# Patient Record
Sex: Male | Born: 2000 | Race: White | Hispanic: No | Marital: Married | Smoking: Never smoker
Health system: Southern US, Community
[De-identification: ages and names within clinical notes are randomized; demographics above are authoritative.]

## PROBLEM LIST (undated history)

## (undated) HISTORY — PX: NO PAST SURGERIES: SHX2092

---
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
## 2023-11-16 ENCOUNTER — Telehealth: Admitting: Physician Assistant

## 2023-11-16 DIAGNOSIS — M549 Dorsalgia, unspecified: Secondary | ICD-10-CM

## 2023-11-16 NOTE — Progress Notes (Signed)
  Because of chronic back pain and need for exam to ensure proper treatment, I feel your condition warrants further evaluation and I recommend that you be seen in a face-to-face visit. If you do not have a PCP outside of the [facility], I recommend being seen at one of our in-person locations (see link below).   NOTE: There will be NO CHARGE for this E-Visit   If you are having a true medical emergency, please call 911.     For an urgent face to face visit, [place] has multiple urgent care centers for your convenience.  Click the link below for the full list of locations and hours, walk-in wait times, appointment scheduling options and driving directions:  Urgent Care - [place], [place], [place], [place], [place], [place]  [place]     Your MyChart E-visit questionnaire answers were reviewed by a board certified advanced clinical practitioner to complete your personal care plan based on your specific symptoms.    Thank you for using e-Visits.

## 2024-01-11 ENCOUNTER — Ambulatory Visit: Admitting: Family Medicine

## 2024-01-11 ENCOUNTER — Encounter: Payer: Self-pay | Admitting: Family Medicine

## 2024-01-11 VITALS — BP 120/82 | HR 83 | Temp 98.5°F | Resp 18 | Ht 72.0 in | Wt 190.4 lb

## 2024-01-11 DIAGNOSIS — M542 Cervicalgia: Secondary | ICD-10-CM

## 2024-01-11 DIAGNOSIS — G43009 Migraine without aura, not intractable, without status migrainosus: Secondary | ICD-10-CM | POA: Diagnosis not present

## 2024-01-11 DIAGNOSIS — G8929 Other chronic pain: Secondary | ICD-10-CM | POA: Diagnosis not present

## 2024-01-11 DIAGNOSIS — R868 Other abnormal findings in specimens from male genital organs: Secondary | ICD-10-CM | POA: Diagnosis not present

## 2024-01-11 MED ORDER — NURTEC 75 MG PO TBDP: 75.0000 mg | ORAL_TABLET | Freq: Every day | ORAL | Status: AC | PRN

## 2024-01-11 MED ORDER — CYCLOBENZAPRINE HCL 10 MG PO TABS: 10.0000 mg | ORAL_TABLET | Freq: Three times a day (TID) | ORAL | 0 refills | Status: AC | PRN

## 2024-01-11 NOTE — Progress Notes (Signed)
 New Patient Office Visit  Subjective    Patient ID: Clinton Stout, male    DOB: 2001-01-06  Age: 23 y.o. MRN: [id number]  CC:  Chief Complaint  Patient presents with   Infertility   Back Pain    Discussed the use of AI scribe software for clinical note transcription with the patient, who gave verbal consent to proceed.  History of Present Illness   Clinton Stout is a 23 year old male who presents to establish care and discuss chronic neck pain and headaches.  Chronic neck pain - Neck pain present for approximately one year - Pain is bilateral and sometimes radiates downward - Described as constant tension - Pain worsens with activity and is relieved by stretching - [facility]; detailed results not received - Pain managed with frequent use of extra strength Tylenol  Headaches - Headaches occur every two to three days - Typically located at the back or top of the head - Sometimes associated with neck pain - Prefers to be in a dark room with lights off during headaches - Tylenol provides relief, but requires a few hours for effect - No prescribed medication for headaches - No aura or warning signs preceding headaches  Low back pain and knee pain - History of low back pain and knee pain documented through the [facility] - No current medication for these conditions - No history of physical therapy - Placed on light duty during [employer] service due to these symptoms  Tinnitus - Intermittent ringing in the ears - Symptom is not constant - No medication for tinnitus  Infertility - Diagnosed with low sperm motility and concentration while in the [employer] - No treatment received due to discharge from [employer] shortly after diagnosis       Outpatient Encounter Medications as of 01/11/2024  Medication Sig   cyclobenzaprine (FLEXERIL) 10 MG tablet Take 1 tablet (10 mg total) by mouth 3 (three) times daily as needed for  muscle spasms.   Rimegepant Sulfate (NURTEC) 75 MG TBDP Take 1 tablet (75 mg total) by mouth daily as needed.   [DISCONTINUED] fexofenadine (ALLEGRA) 180 MG tablet Take 180 mg by mouth daily.   [DISCONTINUED] ibuprofen  (ADVIL ) 600 MG tablet Take 1 tablet (600 mg total) by mouth every 8 (eight) hours as needed for moderate pain.   [DISCONTINUED] lidocaine  (XYLOCAINE ) 2 % solution Gargle 15 mL every 3 hours as needed for sore throat. May swallow if desired.   No facility-administered encounter medications on file as of 01/11/2024.    History reviewed. No pertinent past medical history.  Past Surgical History:  Procedure Laterality Date   NO PAST SURGERIES      Family History  Problem Relation Age of Onset   Healthy Mother    Heart disease Father    Healthy Sister    Healthy Brother     Social History   Socioeconomic History   Marital status: Married    Spouse name: Not on file   Number of children: Not on file   Years of education: Not on file   Highest education level: Not on file  Occupational History   Not on file  Tobacco Use   Smoking status: Never   Smokeless tobacco: Never  Vaping Use   Vaping status: Every Day  Substance and Sexual Activity   Alcohol use: Yes    Alcohol/week: 4.0 standard drinks of alcohol    Types: 4 Cans of beer per  week   Drug use: No   Sexual activity: Yes  Other Topics Concern   Not on file  Social History Narrative   Not on file   Social Drivers of Health   Financial Resource Strain: Not on file  Food Insecurity: Not on file  Transportation Needs: Not on file  Physical Activity: Not on file  Stress: Not on file  Social Connections: Not on file  Intimate Partner Violence: Not on file    Review of Systems  Constitutional:  Negative for chills, fever and malaise/fatigue.  HENT:  Negative for congestion, ear pain, sinus pain and sore throat.   Eyes: Negative.   Respiratory:  Negative for cough, shortness of breath and wheezing.    Cardiovascular:  Negative for chest pain, palpitations and leg swelling.  Gastrointestinal:  Negative for constipation, diarrhea, nausea and vomiting.  Genitourinary:  Negative for dysuria, frequency and urgency.  Musculoskeletal:  Positive for neck pain (bilateral). Negative for back pain.  Skin: Negative.   Neurological:  Positive for headaches (back or top of the head). Negative for dizziness.  Endo/Heme/Allergies: Negative.         Objective    BP 120/82   Pulse 83   Temp 98.5 F (36.9 C) (Temporal)   Resp 18   Ht 6' (1.829 m)   Wt 190 lb 6.4 oz (86.4 kg)   SpO2 99%   BMI 25.82 kg/m   Physical Exam Constitutional:      General: He is not in acute distress.    Appearance: Normal appearance. He is not ill-appearing.  HENT:     Nose: No congestion.  Eyes:     Conjunctiva/sclera: Conjunctivae normal.  Cardiovascular:     Rate and Rhythm: Normal rate and regular rhythm.     Heart sounds: Normal heart sounds. No murmur heard. Pulmonary:     Effort: Pulmonary effort is normal. No respiratory distress.     Breath sounds: Normal breath sounds. No wheezing.  Abdominal:     Palpations: Abdomen is soft.  Musculoskeletal:        General: Normal range of motion.  Skin:    General: Skin is warm.  Neurological:     Mental Status: He is alert and oriented to person, place, and time. Mental status is at baseline.  Psychiatric:        Mood and Affect: Mood normal.        Behavior: Behavior normal.         Assessment & Plan:   Problem List Items Addressed This Visit       Cardiovascular and Mediastinum   Migraine without aura and without status migrainosus, not intractable   Migraine without aura Migraines every two to three days, linked to neck pain. No aura. Tylenol provides partial relief. Nurtec sample given to assess efficacy. Triptan caused sleepiness. - Provided Nurtec sample for migraine management. - Advised to report Nurtec efficacy for potential  prescription. - Discussed insurance coverage for Nurtec and alternatives if not covered.      Relevant Medications   Rimegepant Sulfate (NURTEC) 75 MG TBDP   cyclobenzaprine (FLEXERIL) 10 MG tablet     Other   Chronic neck pain - Primary   Cervicalgia (chronic neck pain) Chronic neck pain for one year, worsened by activity, relieved by manipulation. Associated with headaches. Previous x-rays showed scoliosis. No current medication, uses Tylenol. - Referred to sports medicine for evaluation and management. - Prescribed Flexeril for nighttime muscle relaxation. - Provided neck exercises and  stretches. - Advised to reduce Tylenol by incorporating Flexeril.      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 No treatment initiated. Seeking further evaluation. - Investigate referral options for male infertility specialist. - Advised consultation with wife's gynecologist for infertility specialist referral.       Follow-up: Return in about 2 weeks (around 01/25/2024) for Annual Physical, fasting, lab visit.   Harrie Cedar, FNP [facility] [phone]

## 2024-01-11 NOTE — Patient Instructions (Signed)
 Clinton Chang, DO  - Referral

## 2024-01-14 DIAGNOSIS — R868 Other abnormal findings in specimens from male genital organs: Secondary | ICD-10-CM | POA: Insufficient documentation

## 2024-01-14 NOTE — Assessment & Plan Note (Signed)
 Cervicalgia (chronic neck pain) Chronic neck pain for one year, worsened by activity, relieved by manipulation. Associated with headaches. Previous x-rays showed scoliosis. No current medication, uses Tylenol. - Referred to sports medicine for evaluation and management. - Prescribed Flexeril for nighttime muscle relaxation. - Provided neck exercises and stretches. - Advised to reduce Tylenol by incorporating Flexeril.

## 2024-01-14 NOTE — Assessment & Plan Note (Signed)
 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 No treatment initiated. Seeking further evaluation. - Investigate referral options for male infertility specialist. - Advised consultation with wife's gynecologist for infertility specialist referral.

## 2024-01-14 NOTE — Assessment & Plan Note (Signed)
 Migraine without aura Migraines every two to three days, linked to neck pain. No aura. Tylenol provides partial relief. Nurtec sample given to assess efficacy. Triptan caused sleepiness. - Provided Nurtec sample for migraine management. - Advised to report Nurtec efficacy for potential prescription. - Discussed insurance coverage for Nurtec and alternatives if not covered.

## 2024-01-30 ENCOUNTER — Encounter: Payer: Self-pay | Admitting: Family Medicine

## 2024-01-30 ENCOUNTER — Ambulatory Visit (INDEPENDENT_AMBULATORY_CARE_PROVIDER_SITE_OTHER): Admitting: Family Medicine

## 2024-01-30 VITALS — BP 130/78 | HR 87 | Temp 98.8°F | Resp 18 | Ht 72.0 in | Wt 191.0 lb

## 2024-01-30 DIAGNOSIS — G8929 Other chronic pain: Secondary | ICD-10-CM

## 2024-01-30 DIAGNOSIS — R868 Other abnormal findings in specimens from male genital organs: Secondary | ICD-10-CM

## 2024-01-30 DIAGNOSIS — Z0000 Encounter for general adult medical examination without abnormal findings: Secondary | ICD-10-CM | POA: Diagnosis not present

## 2024-01-30 DIAGNOSIS — M542 Cervicalgia: Secondary | ICD-10-CM | POA: Diagnosis not present

## 2024-01-30 NOTE — Progress Notes (Signed)
 Subjective:  Patient ID: Clinton Stout, male    DOB: 2000-05-21  Age: 23 y.o. MRN: [id number]  Chief Complaint  Patient presents with   Annual Exam   HPI:  Well Adult Physical: Patient here for a comprehensive physical exam.The patient reports no problems Do you take any herbs or supplements that were not prescribed by a doctor? no Are you taking calcium supplements? no Are you taking aspirin daily? no  Encounter for general adult medical examination without abnormal findings  Physical (At Risk items are starred): Patient's last physical exam was 1 year ago .  Patient wears a seat belt, has smoke detectors, has carbon monoxide detectors, practices appropriate gun safety, and wears sunscreen with extended sun exposure. Dental Care: biannual cleanings, brushes and flosses daily. Ophthalmology/Optometry: Annual visit.  Hearing loss: none Vision impairments: none Last PSA:     01/30/2024    8:04 AM  Depression screen PHQ 2/9  Decreased Interest 1  Down, Depressed, Hopeless 0  PHQ - 2 Score 1  Altered sleeping 2  Tired, decreased energy 2  Change in appetite 0  Feeling bad or failure about yourself  0  Trouble concentrating 0  Moving slowly or fidgety/restless 0  Suicidal thoughts 0  PHQ-9 Score 5  Difficult doing work/chores Somewhat difficult         01/30/2024    8:04 AM  Fall Risk  Falls in the past year? 0  Was there an injury with Fall? 0  Fall Risk Category Calculator 0  Patient at Risk for Falls Due to No Fall Risks  Fall risk Follow up Falls evaluation completed              History reviewed. No pertinent past medical history. Past Surgical History:  Procedure Laterality Date   NO PAST SURGERIES      Family History  Problem Relation Age of Onset   Healthy Mother    Heart disease Father    Healthy Sister    Healthy Brother    Social History   Socioeconomic History   Marital status: Married    Spouse name: Not on file   Number of  children: Not on file   Years of education: Not on file   Highest education level: Not on file  Occupational History   Not on file  Tobacco Use   Smoking status: Never   Smokeless tobacco: Never  Vaping Use   Vaping status: Every Day  Substance and Sexual Activity   Alcohol use: Yes    Alcohol/week: 4.0 standard drinks of alcohol    Types: 4 Cans of beer per week   Drug use: No   Sexual activity: Yes  Other Topics Concern   Not on file  Social History Narrative   Not on file   Social Drivers of Health   Financial Resource Strain: Not on file  Food Insecurity: Not on file  Transportation Needs: Not on file  Physical Activity: Not on file  Stress: Not on file  Social Connections: Not on file   Review of Systems  Constitutional:  Negative for appetite change, fatigue and fever.  HENT:  Negative for congestion, ear pain, sinus pressure and sore throat.   Eyes: Negative.   Respiratory:  Negative for cough, chest tightness, shortness of breath and wheezing.   Cardiovascular:  Negative for chest pain and palpitations.  Gastrointestinal:  Negative for abdominal pain, constipation, diarrhea, nausea and vomiting.  Endocrine: Negative.   Genitourinary:  Negative for dysuria,  frequency, hematuria and urgency.  Musculoskeletal:  Negative for arthralgias, back pain, joint swelling and myalgias.  Skin: Negative.  Negative for rash.  Allergic/Immunologic: Negative.   Neurological:  Positive for headaches. Negative for dizziness, weakness and light-headedness.  Hematological: Negative.   Psychiatric/Behavioral:  Negative for dysphoric mood. The patient is not nervous/anxious.      Objective:  BP 130/78   Pulse 87   Temp 98.8 F (37.1 C) (Temporal)   Resp 18   Ht 6' (1.829 m)   Wt 191 lb (86.6 kg)   SpO2 99%   BMI 25.90 kg/m      01/30/2024    8:01 AM 01/11/2024    2:30 PM 08/25/2018   10:24 AM  BP/Weight  Systolic BP 130 120 121  Diastolic BP 78 82 76  Wt. (Lbs) 191  190.4   BMI 25.9 kg/m2 25.82 kg/m2     Physical Exam Vitals reviewed.  Constitutional:      General: He is not in acute distress.    Appearance: Normal appearance. He is well-groomed. He is not ill-appearing.  HENT:     Head: Normocephalic.     Right Ear: Tympanic membrane, ear canal and external ear normal. There is impacted cerumen.     Left Ear: Tympanic membrane, ear canal and external ear normal. There is impacted cerumen.     Nose: Nose normal.     Mouth/Throat:     Mouth: Mucous membranes are moist.     Pharynx: Oropharynx is clear.  Eyes:     Extraocular Movements: Extraocular movements intact.     Conjunctiva/sclera: Conjunctivae normal.     Pupils: Pupils are equal, round, and reactive to light.  Cardiovascular:     Rate and Rhythm: Normal rate and regular rhythm.     Pulses: Normal pulses.     Heart sounds: Normal heart sounds. No murmur heard. Pulmonary:     Effort: Pulmonary effort is normal. No respiratory distress.     Breath sounds: Normal breath sounds. No wheezing.  Abdominal:     General: Bowel sounds are normal.     Palpations: Abdomen is soft. There is no mass.     Tenderness: There is no abdominal tenderness.  Musculoskeletal:        General: Normal range of motion.     Cervical back: Normal range of motion and neck supple.  Lymphadenopathy:     Cervical: No cervical adenopathy.  Skin:    General: Skin is warm and dry.  Neurological:     General: No focal deficit present.     Mental Status: He is alert and oriented to person, place, and time. Mental status is at baseline.     Cranial Nerves: Cranial nerves 2-12 are intact.     Sensory: Sensation is intact.     Motor: Motor function is intact.     Coordination: Coordination is intact.  Psychiatric:        Attention and Perception: Attention and perception normal.        Mood and Affect: Mood normal.        Speech: Speech normal.        Behavior: Behavior normal. Behavior is cooperative.         Thought Content: Thought content normal.        Cognition and Memory: Cognition normal.        Judgment: Judgment normal.     No results found for: WBC, HGB, HCT, PLT, GLUCOSE, CHOL, TRIG, HDL, LDLDIRECT, LDLCALC,  ALT, AST, NA, K, CL, CREATININE, BUN, CO2, TSH, PSA, INR, GLUF, HGBA1C, MICROALBUR    Assessment & Plan:  Annual physical exam Assessment & Plan: Up to date on vaccines and screenings - most of this is completed with [employer].   Things to do to keep yourself healthy  - Exercise at least 30-45 minutes a day, 3-4 days a week.  - Eat a low-fat diet with lots of fruits and vegetables, up to 7-9 servings per day.  - Seatbelts can save your life. Wear them always.  - Smoke detectors on every level of your home, check batteries every year.  - Eye Doctor - have an eye exam every 1-2 years  - Safe sex - if you may be exposed to STDs, use a condom.  - Alcohol -  If you drink, do it moderately, less than 2 drinks per day.  - Health Care Power of Attorney. Choose someone to speak for you if you are not able.  - Depression is common in our stressful world.If you're feeling down or losing interest in things you normally enjoy, please come in for a visit.  - Violence - If anyone is threatening or hurting you, please call immediately.   Orders: -     CBC with Differential/Platelet -     Comprehensive metabolic panel with GFR -     Lipid panel -     TSH  Chronic neck pain Assessment & Plan: Cervicalgia (chronic neck pain) Improved - Not scheduled with sports medicine for evaluation and management due to work schedule. - Continue using Flexeril  for nighttime muscle relaxation as needed. - Continue neck exercises and stretches. - Advised to reduce Tylenol by incorporating Flexeril .   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 No treatment initiated.  Seeking further evaluation. - Investigate referral options for male infertility specialist at wife's OB/GYN appointment this Thursday.       Body mass index is 25.9 kg/m.   These are the goals we discussed:  Goals      Stay Active and Independent     Timeframe:  Long-Range Goal Priority:  Medium Start Date:        01/30/24                      Expected End Date:                       Follow Up Date 01/29/2025   - choose a type of activity I enjoy - use fitness equipment - walk indoors - walk outside    Why is this important?   Regular activity or exercise is important to managing back pain.  Activity helps to keep your muscles strong.  You will sleep better and feel more relaxed.  You will have more energy and feel less stressed.  If you are not active now, start slowly. Little changes make a big difference.  Rest, but not too much.  Stay as active as you can and listen to your body's signals.     Notes:  I want to gain some weight         This is a list of the screening recommended for you and due dates:  Health Maintenance  Topic Date Due   HPV Vaccine (1 - Male 3-dose series) Never done   HIV Screening  Never done   Meningitis B Vaccine (  1 of 2 - Standard) Never done   Hepatitis C Screening  Never done   DTaP/Tdap/Td vaccine (7 - Td or Tdap) 11/22/2021   COVID-19 Vaccine (1 - 2025-26 season) Never done   Flu Shot  Completed   Pneumococcal Vaccine  Aged Out      Follow-up: Return in about 1 year (around 01/29/2025) for Annual Physical, lab visit.  An After Visit Summary was printed and given to the patient.  Harrie Cedar, FNP [facility] [phone]

## 2024-01-30 NOTE — Assessment & Plan Note (Signed)
 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 No treatment initiated. Seeking further evaluation. - Investigate referral options for male infertility specialist at wife's OB/GYN appointment this Thursday.

## 2024-01-30 NOTE — Assessment & Plan Note (Signed)
 Cervicalgia (chronic neck pain) Improved - Not scheduled with sports medicine for evaluation and management due to work schedule. - Continue using Flexeril  for nighttime muscle relaxation as needed. - Continue neck exercises and stretches. - Advised to reduce Tylenol by incorporating Flexeril .

## 2024-01-30 NOTE — Assessment & Plan Note (Signed)
 Up to date on vaccines and screenings - most of this is completed with the [employer].   Things to do to keep yourself healthy  - Exercise at least 30-45 minutes a day, 3-4 days a week.  - Eat a low-fat diet with lots of fruits and vegetables, up to 7-9 servings per day.  - Seatbelts can save your life. Wear them always.  - Smoke detectors on every level of your home, check batteries every year.  - Eye Doctor - have an eye exam every 1-2 years  - Safe sex - if you may be exposed to STDs, use a condom.  - Alcohol -  If you drink, do it moderately, less than 2 drinks per day.  - Health Care Power of Attorney. Choose someone to speak for you if you are not able.  - Depression is common in our stressful world.If you're feeling down or losing interest in things you normally enjoy, please come in for a visit.  - Violence - If anyone is threatening or hurting you, please call immediately.

## 2024-01-31 LAB — CBC WITH DIFFERENTIAL/PLATELET
Basophils Absolute: 0 x10E3/uL (ref 0.0–0.2)
Basos: 1 %
EOS (ABSOLUTE): 0 x10E3/uL (ref 0.0–0.4)
Eos: 1 %
Hematocrit: 42.4 % (ref 37.5–51.0)
Hemoglobin: 14 g/dL (ref 13.0–17.7)
Immature Grans (Abs): 0 x10E3/uL (ref 0.0–0.1)
Immature Granulocytes: 0 %
Lymphocytes Absolute: 1.1 x10E3/uL (ref 0.7–3.1)
Lymphs: 29 %
MCH: 31.6 pg (ref 26.6–33.0)
MCHC: 33 g/dL (ref 31.5–35.7)
MCV: 96 fL (ref 79–97)
Monocytes Absolute: 0.2 x10E3/uL (ref 0.1–0.9)
Monocytes: 6 %
Neutrophils Absolute: 2.5 x10E3/uL (ref 1.4–7.0)
Neutrophils: 63 %
Platelets: 215 x10E3/uL (ref 150–450)
RBC: 4.43 x10E6/uL (ref 4.14–5.80)
RDW: 12.1 % (ref 11.6–15.4)
WBC: 3.9 x10E3/uL (ref 3.4–10.8)

## 2024-01-31 LAB — LIPID PANEL
Chol/HDL Ratio: 2.7 ratio (ref 0.0–5.0)
Cholesterol, Total: 189 mg/dL (ref 100–199)
HDL: 69 mg/dL (ref >39)
LDL Chol Calc (NIH): 107 mg/dL — ABNORMAL HIGH (ref 0–99)
Triglycerides: 68 mg/dL (ref 0–149)
VLDL Cholesterol Cal: 13 mg/dL (ref 5–40)

## 2024-01-31 LAB — COMPREHENSIVE METABOLIC PANEL WITH GFR
ALT: 22 IU/L (ref 0–44)
AST: 20 IU/L (ref 0–40)
Albumin: 5.1 g/dL (ref 4.3–5.2)
Alkaline Phosphatase: 71 IU/L (ref 47–123)
BUN/Creatinine Ratio: 10 (ref 9–20)
BUN: 9 mg/dL (ref 6–20)
Bilirubin Total: 0.6 mg/dL (ref 0.0–1.2)
CO2: 22 mmol/L (ref 20–29)
Calcium: 10.1 mg/dL (ref 8.7–10.2)
Chloride: 101 mmol/L (ref 96–106)
Creatinine, Ser: 0.92 mg/dL (ref 0.76–1.27)
Globulin, Total: 2.3 g/dL (ref 1.5–4.5)
Glucose: 91 mg/dL (ref 70–99)
Potassium: 4.5 mmol/L (ref 3.5–5.2)
Sodium: 140 mmol/L (ref 134–144)
Total Protein: 7.4 g/dL (ref 6.0–8.5)
eGFR: 120 mL/min/1.73 (ref >59)

## 2024-01-31 LAB — TSH: TSH: 2.51 u[IU]/mL (ref 0.450–4.500)

## 2024-02-05 ENCOUNTER — Ambulatory Visit: Payer: Self-pay | Admitting: Family Medicine

## 2025-02-03 ENCOUNTER — Encounter: Admitting: Family Medicine
